# Patient Record
Sex: Male | Born: 1965 | Race: White | Hispanic: No | Marital: Married | Smoking: Never smoker
Health system: Southern US, Community
[De-identification: ages and names within clinical notes are randomized; demographics above are authoritative.]

## PROBLEM LIST (undated history)

## (undated) DIAGNOSIS — T7840XA Allergy, unspecified, initial encounter: Secondary | ICD-10-CM

## (undated) HISTORY — DX: Allergy, unspecified, initial encounter: T78.40XA

---

## 2006-12-24 ENCOUNTER — Ambulatory Visit (HOSPITAL_COMMUNITY): Admission: RE | Admit: 2006-12-24 | Discharge: 2006-12-24 | Payer: Self-pay | Admitting: Family Medicine

## 2007-04-14 ENCOUNTER — Encounter: Admission: RE | Admit: 2007-04-14 | Discharge: 2007-04-14 | Payer: Self-pay | Admitting: Family Medicine

## 2008-08-10 IMAGING — US US RENAL
1 series · 14 of 25 positions shown · non-contrast
Comparison: 12/24/06.

CLINICAL DATA: Follow-up right renal cyst.
RENAL/URINARY TRACT ULTRASOUND:
TECHNIQUE: Complete ultrasound of the urinary tract was performed including evaluation of the kidney, renal collecting systems, and urinary bladder.

[Series 1: us renal · 0.30mm/px · 14 of 41 slices shown]
[im 1/41]
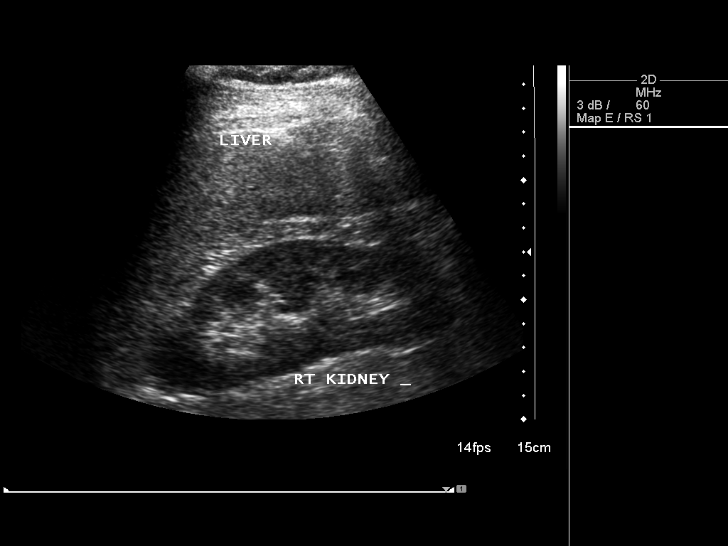
[im 4/41]
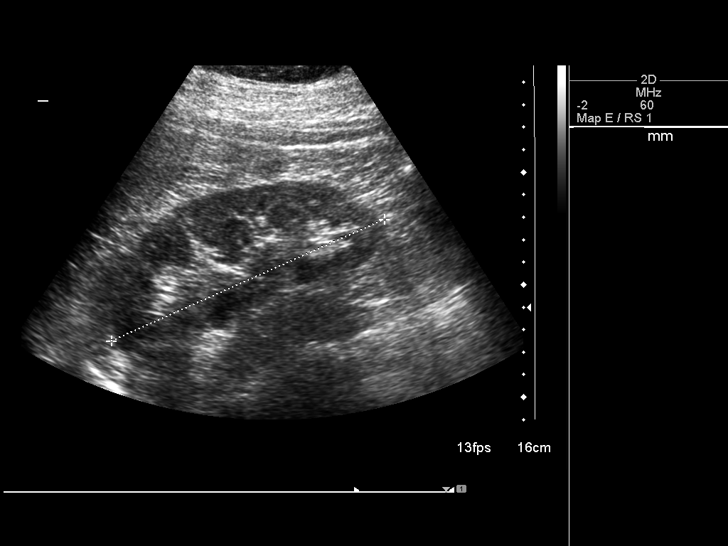
[im 7/41]
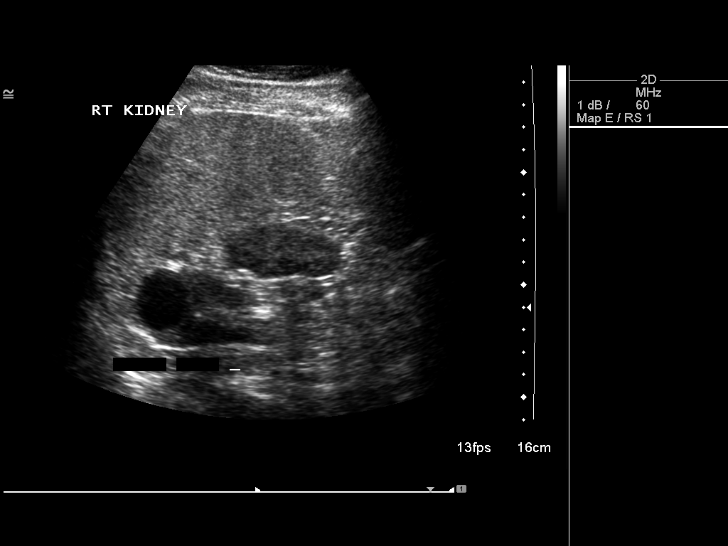
[im 11/41]
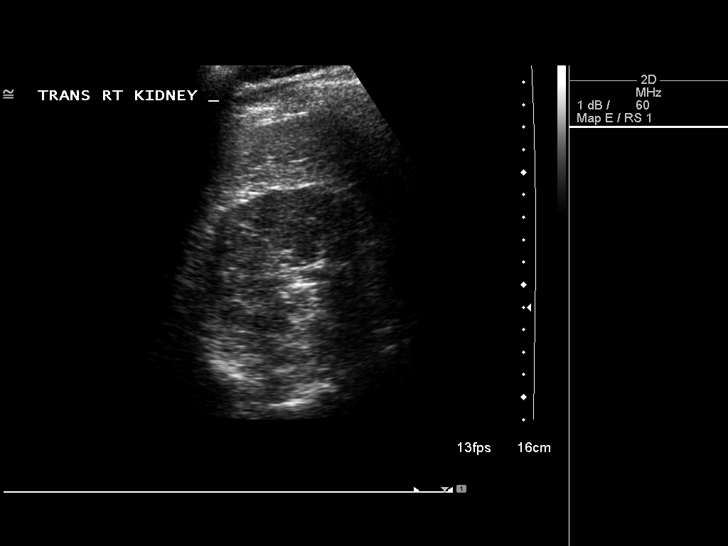
[im 14/41]
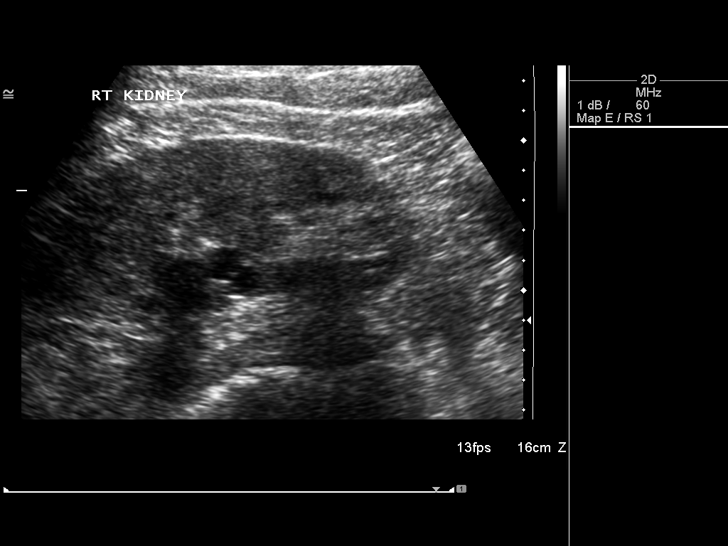
[im 16/41]
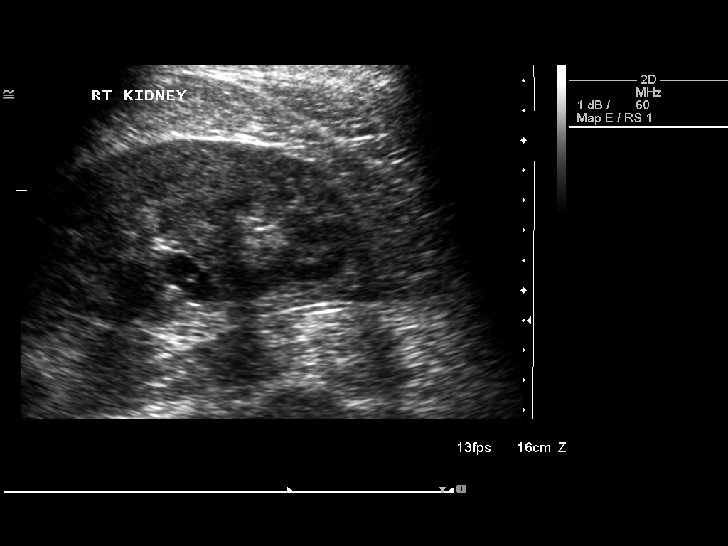
[im 19/41]
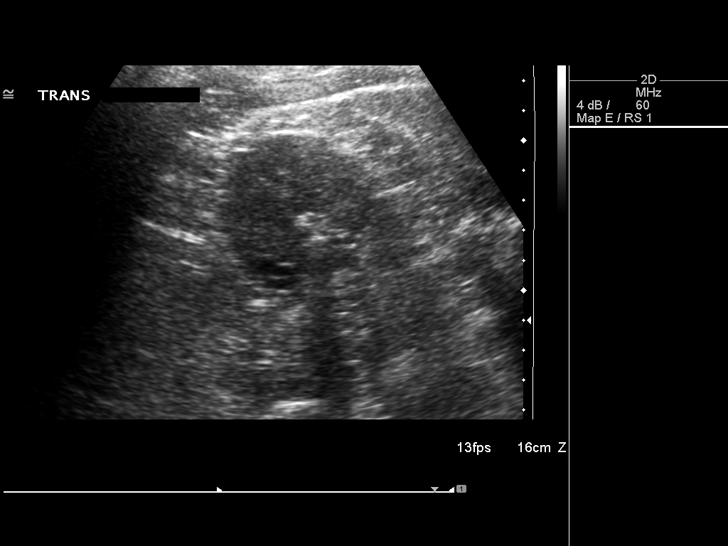
[im 22/41]
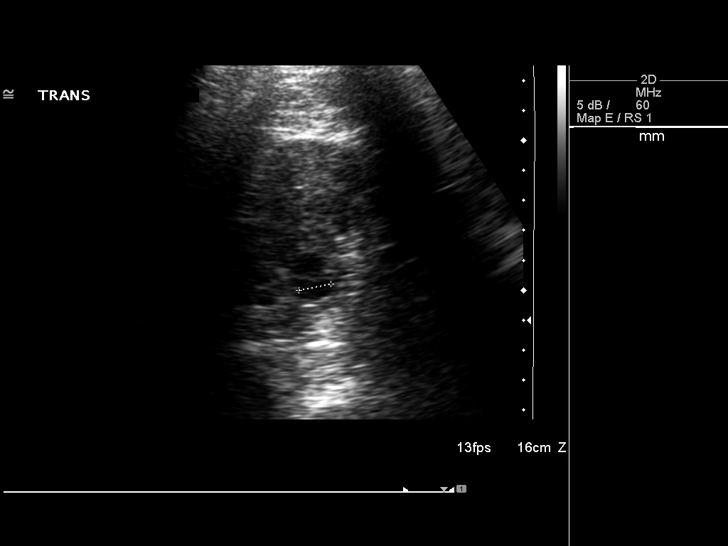
[im 26/41]
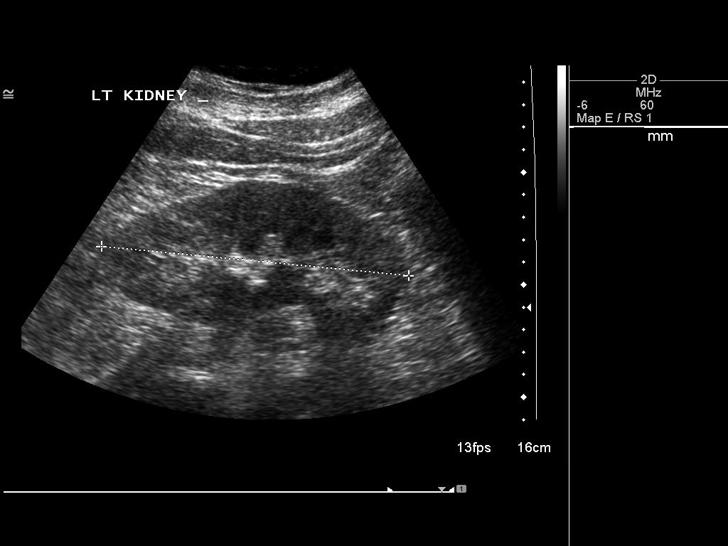
[im 27/41]
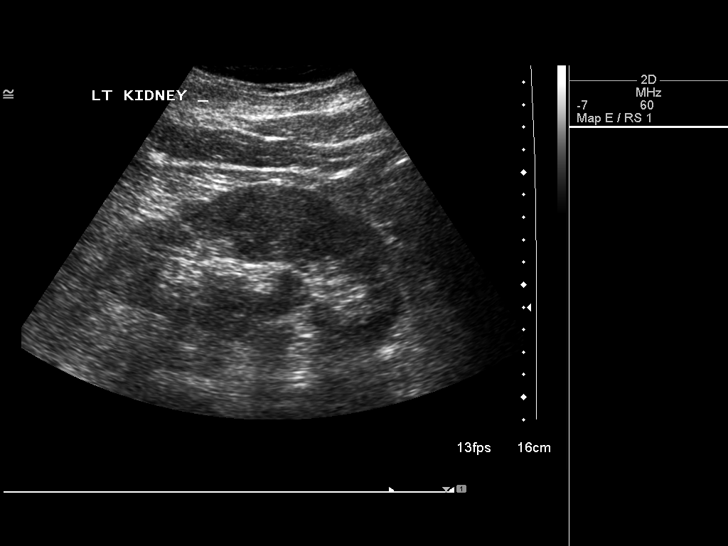
[im 31/41]
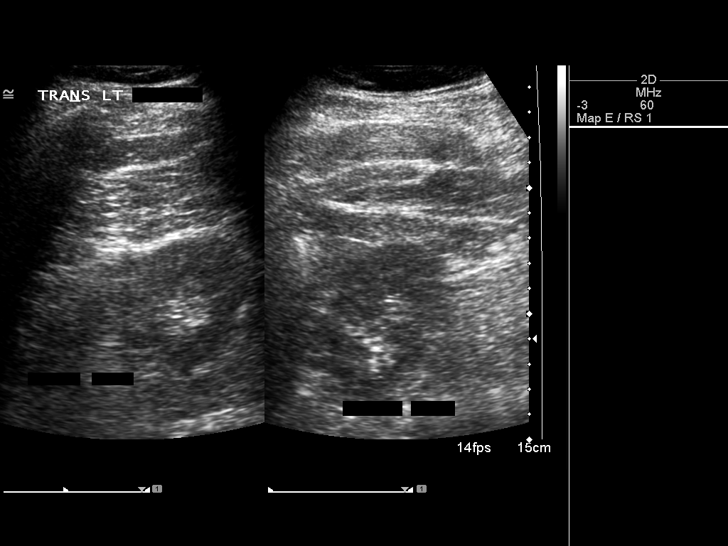
[im 34/41]
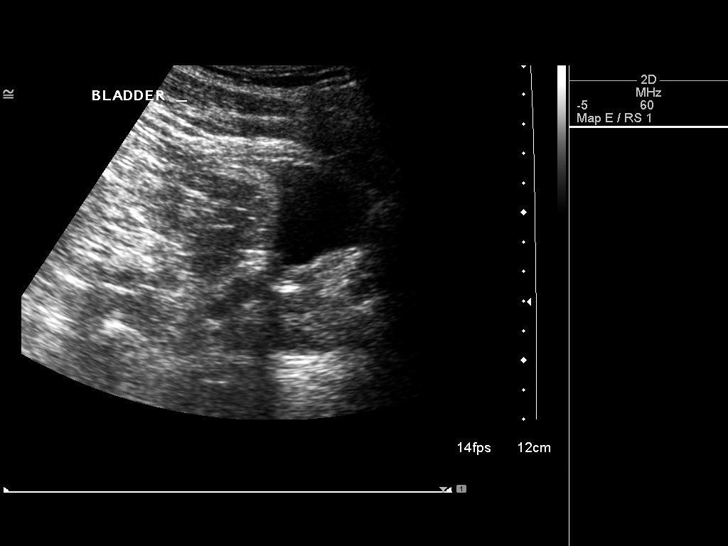
[im 37/41]
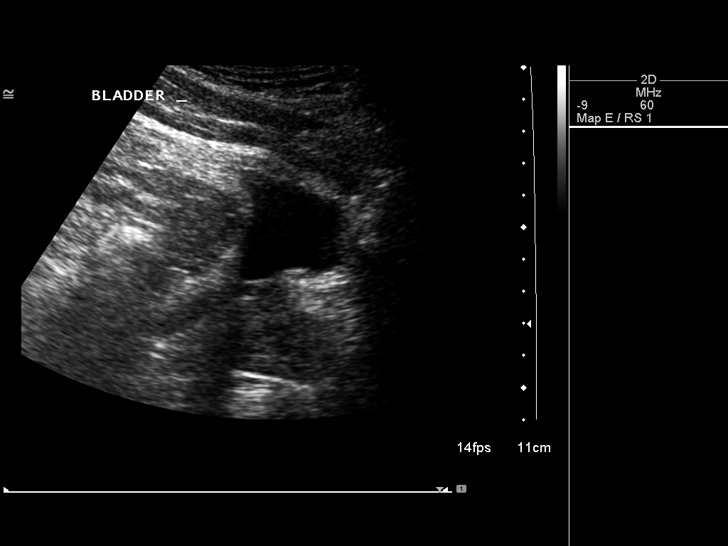
[im 41/41]
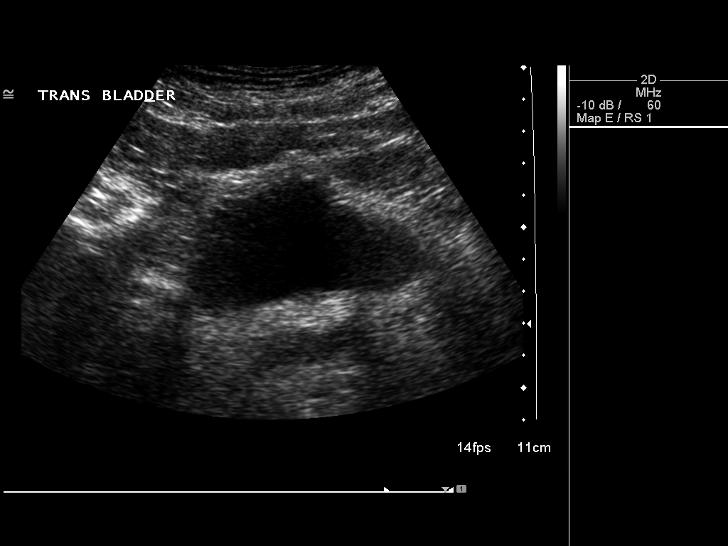

[14 of 25 positions shown; findings below may reference images not displayed]

The right and left kidneys measure 13.6 cm bilaterally.  There is a 2.4 x 2.9 x 3.1 cm cyst in the upper pole of the right kidney which compares to a 2.4 x 2.4 x 3.3 cm cyst on the prior exam.  Two adjacent cysts versus a septated cyst in the mid polar region of the right kidney measuring 1.1 x 1.3 x 2.0 cm compared to 1.1 x 1.3 x 1.6 cm on the prior exam.  The right and left kidneys measure 13.6 and 13.7 cm in length, respectively.  The urinary bladder is decompressed.
IMPRESSION: Overall stable appearance of cyst in the right upper and mid polar regions of the right kidney.  See comments above.

## 2010-06-29 ENCOUNTER — Encounter: Payer: Self-pay | Admitting: Family Medicine

## 2011-07-28 ENCOUNTER — Other Ambulatory Visit: Payer: Self-pay | Admitting: Internal Medicine

## 2011-07-29 ENCOUNTER — Other Ambulatory Visit: Payer: Self-pay

## 2011-07-29 MED ORDER — SILDENAFIL CITRATE 100 MG PO TABS: 100.0000 mg | ORAL_TABLET | ORAL | Status: DC | PRN

## 2011-08-18 ENCOUNTER — Other Ambulatory Visit: Payer: Self-pay | Admitting: Physician Assistant

## 2011-10-01 ENCOUNTER — Other Ambulatory Visit: Payer: Self-pay | Admitting: Internal Medicine

## 2011-10-25 ENCOUNTER — Ambulatory Visit (INDEPENDENT_AMBULATORY_CARE_PROVIDER_SITE_OTHER): Payer: BC Managed Care – PPO | Admitting: Physician Assistant

## 2011-10-25 VITALS — BP 145/90 | HR 87 | Temp 98.2°F | Resp 16 | Ht 75.0 in | Wt 254.0 lb

## 2011-10-25 DIAGNOSIS — J309 Allergic rhinitis, unspecified: Secondary | ICD-10-CM

## 2011-10-25 DIAGNOSIS — J329 Chronic sinusitis, unspecified: Secondary | ICD-10-CM

## 2011-10-25 DIAGNOSIS — N529 Male erectile dysfunction, unspecified: Secondary | ICD-10-CM

## 2011-10-25 MED ORDER — PREDNISONE 20 MG PO TABS: ORAL_TABLET | ORAL | Status: AC

## 2011-10-25 MED ORDER — FLUTICASONE PROPIONATE 50 MCG/ACT NA SUSP: 2.0000 | Freq: Every day | NASAL | Status: DC

## 2011-10-25 MED ORDER — SILDENAFIL CITRATE 100 MG PO TABS: 100.0000 mg | ORAL_TABLET | ORAL | Status: DC | PRN

## 2011-10-25 MED ORDER — AMOXICILLIN 875 MG PO TABS: 875.0000 mg | ORAL_TABLET | Freq: Two times a day (BID) | ORAL | Status: AC

## 2011-10-25 NOTE — Patient Instructions (Addendum)
Increase fluids Continue Claritin daily to help with congestion Monitor elevated blood pressure. Likely due to taking Sudafed but should recheck after illness has resolved

## 2011-10-25 NOTE — Progress Notes (Signed)
  Subjective:    Patient ID: Adam Graham, male    DOB: 12-23-1965, 46 y.o.   MRN: [id number]  Sinusitis Associated symptoms include congestion, headaches and sinus pressure. Pertinent negatives include no chills, ear pain or sore throat.   Patient presents with sinus pressure and nasal congestion x 3 days. Has a history of similar symptoms 1-2 times per year. Has been taking Claritin-D and before that Sudafed which was helping initially but now does not seem to be effective. Patient also complains of headache relieved with Aleve as well as thick nasal congestion.  Denies cough, otalgia, fever, chills, nausea or vomiting.   Patient is also requesting a refill on his Viagra. States he has been using as needed for over a year and is tolerating it well without any adverse effects.     Review of Systems  Constitutional: Negative for fever and chills.  HENT: Positive for congestion, postnasal drip and sinus pressure. Negative for ear pain and sore throat.   Respiratory: Negative for chest tightness.   Skin: Negative for rash.  Neurological: Positive for headaches. Negative for dizziness.       Objective:   Physical Exam  Constitutional: He is oriented to person, place, and time. He appears well-developed and well-nourished.  HENT:  Head: Normocephalic and atraumatic.  Right Ear: External ear normal.  Left Ear: External ear normal.  Mouth/Throat: Oropharynx is clear and moist.  Eyes: Conjunctivae are normal.  Cardiovascular: Normal rate and regular rhythm.   Pulmonary/Chest: Effort normal and breath sounds normal.  Neurological: He is alert and oriented to person, place, and time.          Assessment & Plan:   1. Sinusitis  predniSONE (DELTASONE) 20 MG tablet, amoxicillin (AMOXIL) 875 MG tablet  2. Allergic rhinitis  Continue plain Claritin, andrecommend d/c Sudafed fluticasone (FLONASE) 50 MCG/ACT nasal spray  3. Erectile dysfunction  sildenafil (VIAGRA) 100 MG tablet, as needed

## 2011-11-02 NOTE — Progress Notes (Signed)
Precepted with Ms. Marte, PA-C and agree.  

## 2012-03-23 ENCOUNTER — Other Ambulatory Visit: Payer: Self-pay | Admitting: Physician Assistant

## 2012-05-21 ENCOUNTER — Other Ambulatory Visit: Payer: Self-pay | Admitting: Physician Assistant

## 2012-05-29 ENCOUNTER — Other Ambulatory Visit: Payer: Self-pay | Admitting: Physician Assistant

## 2012-06-09 ENCOUNTER — Other Ambulatory Visit: Payer: Self-pay | Admitting: Physician Assistant

## 2012-07-05 ENCOUNTER — Other Ambulatory Visit: Payer: Self-pay | Admitting: Physician Assistant

## 2012-11-20 ENCOUNTER — Other Ambulatory Visit: Payer: Self-pay | Admitting: Physician Assistant

## 2012-11-21 NOTE — Telephone Encounter (Signed)
Needs OV for further refills

## 2012-12-19 ENCOUNTER — Other Ambulatory Visit: Payer: Self-pay | Admitting: Physician Assistant

## 2013-05-02 ENCOUNTER — Ambulatory Visit (INDEPENDENT_AMBULATORY_CARE_PROVIDER_SITE_OTHER): Payer: BC Managed Care – PPO | Admitting: Family Medicine

## 2013-05-02 VITALS — BP 130/88 | HR 79 | Temp 98.7°F | Resp 16 | Ht 76.4 in | Wt 248.2 lb

## 2013-05-02 DIAGNOSIS — J309 Allergic rhinitis, unspecified: Secondary | ICD-10-CM

## 2013-05-02 DIAGNOSIS — Z23 Encounter for immunization: Secondary | ICD-10-CM

## 2013-05-02 DIAGNOSIS — N529 Male erectile dysfunction, unspecified: Secondary | ICD-10-CM

## 2013-05-02 DIAGNOSIS — J329 Chronic sinusitis, unspecified: Secondary | ICD-10-CM

## 2013-05-02 MED ORDER — SILDENAFIL CITRATE 100 MG PO TABS: 50.0000 mg | ORAL_TABLET | Freq: Every day | ORAL | Status: AC | PRN

## 2013-05-02 MED ORDER — AMOXICILLIN-POT CLAVULANATE 875-125 MG PO TABS: 1.0000 | ORAL_TABLET | Freq: Two times a day (BID) | ORAL | Status: AC

## 2013-05-02 MED ORDER — FLUTICASONE PROPIONATE 50 MCG/ACT NA SUSP: 2.0000 | Freq: Every day | NASAL | Status: DC

## 2013-05-02 NOTE — Patient Instructions (Addendum)
Saline nasal spray atleast 4 times per day, over the counter mucinex or mucinex DM if needed for cough, drink plenty of fluids. If sinus symptoms not improving in next few days can start antibiotic, but ok to continue allergy meds and make sure to start these medicines during typical time of allergy flair.   We will call you to schedule appointment for physical/primary provider.   You should receive a call or letter about your lab results within the next week to 10 days.   We will refer you to the urologist to discuss vasectomy and can also discuss difficulty with erections with urologist. Rip Harbour to continue Viagra for now.   Return to the clinic or go to the nearest emergency room if any of your symptoms worsen or new symptoms occur.

## 2013-05-02 NOTE — Progress Notes (Addendum)
Subjective:  This chart was scribed for Meredith Staggers, MD by Quintella Reichert, ED scribe.  This patient was seen in room [facility] Room 9 and the patient's care was started at 9:18 AM.   Patient ID: Adam Graham, male    DOB: 1966/02/01, 47 y.o.   MRN: [id number]  Chief Complaint  Patient presents with  . Sinusitis    drainage and headache  . Medication Refill    viagra and flonase    HPI  Adam Graham is a 47 y.o. male PCP: None per patient  Pt presents with a 6-day history of sinus congestion, cough, drainage, headache, rhinorrhea and sneezing.  He denies fever.  He localizes headache to area of left frontal sinus.  Mucus is yellowish-green.  He has attempted to treat symptoms with Sudafed and Zyrtec, but symptoms have continued.  Pt states "I think I have a sinus infection."  No recent sick contacts.  He denies prior recent sinus infections in the past several months.    Pt also requests medication refill for Viagra and Flonase.  Last seen on Oct 25, 2011 by Rhoderick Moody, PA.  Viagra 100mg  tablets were prescribed then for ED.  He states he has been taking Viagra 2x/week, 1/2 tablet at a time.  He states he mostly has trouble maintaining an erection.  He notes occasional flushing when taking Viagra but denies any other side effects.  Pt has not had testosterone levels checked recently.    Pt has not had a recent physical, no PCP.   Would also like to be referred to urologist to discuss vasectomy. Pt is legally separated and has 2 children at ages 5 and 58.   There are no active problems to display for this patient.   Past Medical History  Diagnosis Date  . Allergy     History reviewed. No pertinent past surgical history.  No Known Allergies   Prior to Admission medications   Medication Sig Start Date End Date Taking? Authorizing Provider  fluticasone (FLONASE) 50 MCG/ACT nasal spray Place 2 sprays into the nose daily. 10/25/11 05/02/13 Yes Heather M Marte, PA-C   sildenafil (VIAGRA) 100 MG tablet Take 1 tablet (100 mg total) by mouth as needed for erectile dysfunction. NEED VISIT!! 11/20/12  Yes Godfrey Pick, PA-C    History   Social History  . Marital Status: Married    Spouse Name: N/A    Number of Children: N/A  . Years of Education: N/A   Occupational History  . Not on file.   Social History Main Topics  . Smoking status: Never Smoker   . Smokeless tobacco: Former Neurosurgeon    Types: Snuff    Quit date: 10/25/2006  . Alcohol Use: Yes  . Drug Use: No  . Sexual Activity: Not on file   Other Topics Concern  . Not on file   Social History Narrative  . No narrative on file     Review of Systems  Constitutional: Negative for fever, fatigue and unexpected weight change.  HENT: Positive for congestion, rhinorrhea, sinus pressure and sneezing.   Eyes: Negative for visual disturbance.  Respiratory: Positive for cough. Negative for chest tightness and shortness of breath.   Cardiovascular: Negative for chest pain, palpitations and leg swelling.  Gastrointestinal: Negative for abdominal pain and blood in stool.  Genitourinary:       Difficulty maintaining erection  Neurological: Positive for headaches. Negative for dizziness and light-headedness.        Objective:  Physical Exam  Nursing note and vitals reviewed. Constitutional: He is oriented to person, place, and time. He appears well-developed and well-nourished. No distress.  HENT:  Head: Normocephalic and atraumatic.  Right Ear: Tympanic membrane, external ear and ear canal normal.  Left Ear: Tympanic membrane, external ear and ear canal normal.  Nose: No rhinorrhea.  Mouth/Throat: Oropharynx is clear and moist and mucous membranes are normal. No oropharyngeal exudate or posterior oropharyngeal erythema.  Sinuses nontender but does describe area of headache as left frontal sinus  Eyes: Conjunctivae and EOM are normal. Pupils are equal, round, and reactive to light.  Neck:  Neck supple. No JVD present. Carotid bruit is not present. No tracheal deviation present.  Cardiovascular: Normal rate, regular rhythm, normal heart sounds and intact distal pulses.   No murmur heard. Pulmonary/Chest: Effort normal and breath sounds normal. No respiratory distress. He has no wheezes. He has no rhonchi. He has no rales.  Abdominal: Soft. There is no tenderness.  Musculoskeletal: Normal range of motion. He exhibits no edema.  Lymphadenopathy:    He has no cervical adenopathy.  Neurological: He is alert and oriented to person, place, and time.  Skin: Skin is warm and dry. No rash noted.  Psychiatric: He has a normal mood and affect. His behavior is normal.     Filed Vitals:   05/02/13 0830  BP: 130/88  Pulse: 79  Temp: 98.7 F (37.1 C)  TempSrc: Oral  Resp: 16  Height: 6' 4.4" (1.941 m)  Weight: 248 lb 3.2 oz (112.583 kg)  SpO2: 99%       Assessment & Plan:   Adam Graham is a 47 y.o. male Allergic rhinitis - Plan: fluticasone (FLONASE) 50 MCG/ACT nasal spray Sinusitis - Plan: amoxicillin-clavulanate (AUGMENTIN) 875-125 MG per tablet URi vs allergies vs early sinusitis.  Sx care for now, ok to cont flonase, but start Augmentin if not improving next few days. rtc precautions.   Erectile dysfunction - Plan: sildenafil (VIAGRA) 100 MG tablet, Testosterone level checked, Ambulatory referral to Urology for discussion of vasectomy and erectile dysfunction.   Need for prophylactic vaccination and inoculation against influenza - Plan: Flu Vaccine QUAD 36+ mos IM given. Afebrile.    Meds ordered this encounter  Medications  . fluticasone (FLONASE) 50 MCG/ACT nasal spray    Sig: Place 2 sprays into both nostrils daily.    Dispense:  16 g    Refill:  12  . sildenafil (VIAGRA) 100 MG tablet    Sig: Take 0.5-1 tablets (50-100 mg total) by mouth daily as needed for erectile dysfunction.    Dispense:  8 tablet    Refill:  5  . amoxicillin-clavulanate (AUGMENTIN)  875-125 MG per tablet    Sig: Take 1 tablet by mouth 2 (two) times daily.    Dispense:  20 tablet    Refill:  0   Patient Instructions  Saline nasal spray atleast 4 times per day, over the counter mucinex or mucinex DM if needed for cough, drink plenty of fluids. If sinus symptoms not improving in next few days can start antibiotic, but ok to continue allergy meds and make sure to start these medicines during typical time of allergy flair.   We will call you to schedule appointment for physical/primary provider.   You should receive a call or letter about your lab results within the next week to 10 days.   We will refer you to the urologist to discuss vasectomy and can also discuss difficulty with  erections with urologist. Rip Harbour to continue Viagra for now.   Return to the clinic or go to the nearest emergency room if any of your symptoms worsen or new symptoms occur.     I personally performed the services described in this documentation, which was scribed in my presence. The recorded information has been reviewed and considered, and addended by me as needed.

## 2013-05-08 NOTE — Progress Notes (Signed)
appt made for jan. 5 for CPE

## 2013-05-16 ENCOUNTER — Encounter: Payer: Self-pay | Admitting: Radiology

## 2013-06-12 ENCOUNTER — Encounter: Payer: Self-pay | Admitting: Family Medicine

## 2013-06-14 NOTE — Progress Notes (Signed)
This encounter was created in error - please disregard.

## 2014-04-21 ENCOUNTER — Other Ambulatory Visit: Payer: Self-pay | Admitting: Family Medicine

## 2014-05-17 ENCOUNTER — Other Ambulatory Visit: Payer: Self-pay | Admitting: Family Medicine

## 2014-07-10 ENCOUNTER — Encounter: Payer: Self-pay | Admitting: *Deleted

## 2014-07-10 DIAGNOSIS — N529 Male erectile dysfunction, unspecified: Secondary | ICD-10-CM | POA: Insufficient documentation
